# Patient Record
Sex: Female | Born: 1965 | Race: White | Hispanic: No | Marital: Married | Smoking: Never smoker
Health system: Southern US, Community
[De-identification: ages and names within clinical notes are randomized; demographics above are authoritative.]

## PROBLEM LIST (undated history)

## (undated) DIAGNOSIS — T4145XA Adverse effect of unspecified anesthetic, initial encounter: Secondary | ICD-10-CM

## (undated) DIAGNOSIS — R112 Nausea with vomiting, unspecified: Secondary | ICD-10-CM

## (undated) DIAGNOSIS — K831 Obstruction of bile duct: Secondary | ICD-10-CM

## (undated) DIAGNOSIS — T8859XA Other complications of anesthesia, initial encounter: Secondary | ICD-10-CM

## (undated) DIAGNOSIS — O30009 Twin pregnancy, unspecified number of placenta and unspecified number of amniotic sacs, unspecified trimester: Secondary | ICD-10-CM

## (undated) DIAGNOSIS — Z9889 Other specified postprocedural states: Secondary | ICD-10-CM

## (undated) DIAGNOSIS — E039 Hypothyroidism, unspecified: Secondary | ICD-10-CM

---

## 2001-04-19 HISTORY — PX: PANCREATIC CYST DRAINAGE: SHX2156

## 2004-11-19 ENCOUNTER — Encounter (INDEPENDENT_AMBULATORY_CARE_PROVIDER_SITE_OTHER): Payer: Self-pay | Admitting: Specialist

## 2004-11-19 ENCOUNTER — Ambulatory Visit (HOSPITAL_COMMUNITY): Admission: RE | Admit: 2004-11-19 | Discharge: 2004-11-19 | Payer: Self-pay | Admitting: *Deleted

## 2004-12-24 ENCOUNTER — Other Ambulatory Visit: Admission: RE | Admit: 2004-12-24 | Discharge: 2004-12-24 | Payer: Self-pay | Admitting: Obstetrics and Gynecology

## 2005-01-15 ENCOUNTER — Encounter: Admission: RE | Admit: 2005-01-15 | Discharge: 2005-01-15 | Payer: Self-pay | Admitting: Gastroenterology

## 2010-01-26 ENCOUNTER — Encounter: Admission: RE | Admit: 2010-01-26 | Discharge: 2010-01-26 | Payer: Self-pay | Admitting: Obstetrics and Gynecology

## 2010-01-29 ENCOUNTER — Encounter: Admission: RE | Admit: 2010-01-29 | Discharge: 2010-01-29 | Payer: Self-pay | Admitting: Obstetrics and Gynecology

## 2011-06-24 LAB — OB RESULTS CONSOLE RPR: RPR: NONREACTIVE

## 2011-06-24 LAB — OB RESULTS CONSOLE GC/CHLAMYDIA: Chlamydia: NEGATIVE

## 2011-06-24 LAB — OB RESULTS CONSOLE HEPATITIS B SURFACE ANTIGEN: Hepatitis B Surface Ag: NEGATIVE

## 2011-06-24 LAB — OB RESULTS CONSOLE ABO/RH: RH Type: POSITIVE

## 2011-06-24 LAB — OB RESULTS CONSOLE HIV ANTIBODY (ROUTINE TESTING): HIV: NONREACTIVE

## 2011-07-20 ENCOUNTER — Other Ambulatory Visit: Payer: Self-pay

## 2011-11-03 ENCOUNTER — Encounter: Payer: BC Managed Care – PPO | Attending: Obstetrics and Gynecology | Admitting: *Deleted

## 2011-11-03 VITALS — Ht 70.5 in | Wt 257.9 lb

## 2011-11-03 DIAGNOSIS — Z713 Dietary counseling and surveillance: Secondary | ICD-10-CM | POA: Insufficient documentation

## 2011-11-03 DIAGNOSIS — O24419 Gestational diabetes mellitus in pregnancy, unspecified control: Secondary | ICD-10-CM

## 2011-11-03 DIAGNOSIS — O99810 Abnormal glucose complicating pregnancy: Secondary | ICD-10-CM | POA: Insufficient documentation

## 2011-11-04 ENCOUNTER — Encounter: Payer: Self-pay | Admitting: *Deleted

## 2011-11-04 NOTE — Patient Instructions (Signed)
Goals:  Check glucose levels per MD as instructed  Follow Gestational Diabetes Diet as instructed  Call for follow-up as needed    

## 2011-11-04 NOTE — Progress Notes (Signed)
  Patient was seen on 11/03/11 for Gestational Diabetes self-management class at the [facility]. The following learning objectives were met by the patient during this course:   States the definition of Gestational Diabetes  States why dietary management is important in controlling blood glucose  Describes the effects each nutrient has on blood glucose levels  Demonstrates ability to create a balanced meal plan  Demonstrates carbohydrate counting   States when to check blood glucose levels  Demonstrates proper blood glucose monitoring techniques  States the effect of stress and exercise on blood glucose levels  States the importance of limiting caffeine and abstaining from alcohol and smoking  Blood glucose monitor given: One Touch Ultra Mini Self Monitoring Kit  Lot # [id number]  Exp: 1/14 Blood glucose reading: 89 mg/dl  Patient instructed to monitor glucose levels: FBS: 60 - <90 2 hour: <120  *Patient received handouts:  Nutrition Diabetes and Pregnancy  Carbohydrate Counting List  Patient will be seen for follow-up as needed.

## 2011-11-10 ENCOUNTER — Other Ambulatory Visit (HOSPITAL_COMMUNITY): Payer: Self-pay | Admitting: Obstetrics and Gynecology

## 2011-11-17 ENCOUNTER — Ambulatory Visit (HOSPITAL_COMMUNITY)
Admission: RE | Admit: 2011-11-17 | Discharge: 2011-11-17 | Disposition: A | Discharge status: Home / Self Care | Payer: BC Managed Care – PPO | Source: Ambulatory Visit | Attending: Obstetrics and Gynecology | Admitting: Obstetrics and Gynecology

## 2011-11-17 ENCOUNTER — Encounter (HOSPITAL_COMMUNITY): Payer: Self-pay

## 2011-11-17 DIAGNOSIS — O30009 Twin pregnancy, unspecified number of placenta and unspecified number of amniotic sacs, unspecified trimester: Secondary | ICD-10-CM | POA: Insufficient documentation

## 2011-11-17 DIAGNOSIS — O99810 Abnormal glucose complicating pregnancy: Secondary | ICD-10-CM | POA: Insufficient documentation

## 2011-11-17 DIAGNOSIS — E079 Disorder of thyroid, unspecified: Secondary | ICD-10-CM | POA: Insufficient documentation

## 2011-11-17 IMAGING — US US OB DETAIL+14 WK
1 series · 14 of 28 positions shown · non-contrast
Comparison: none

[Series 1: us ob detail+14 wk · 0.30mm/px · 14 of 138 slices shown]
[im 6/138]
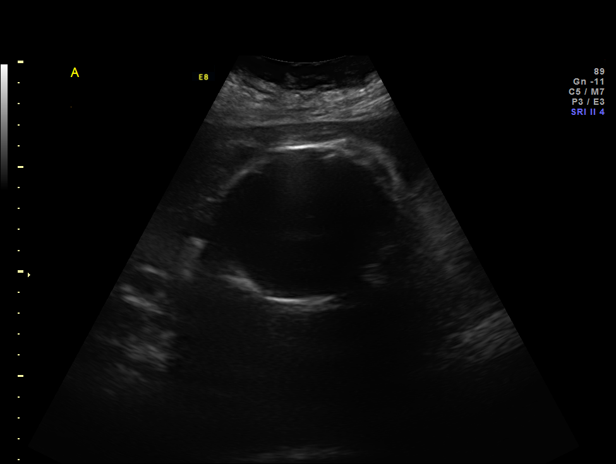
[im 16/138]
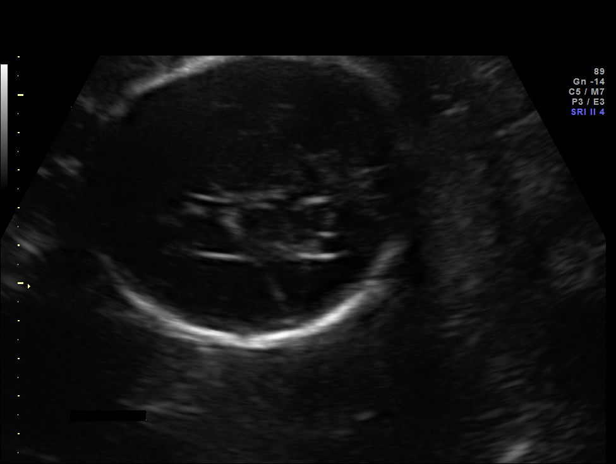
[im 26/138]
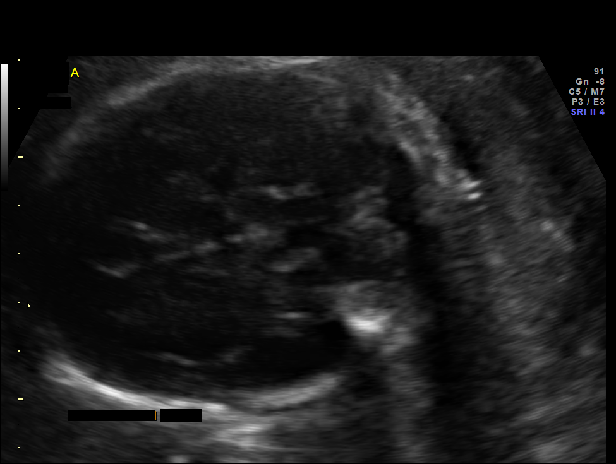
[im 36/138]
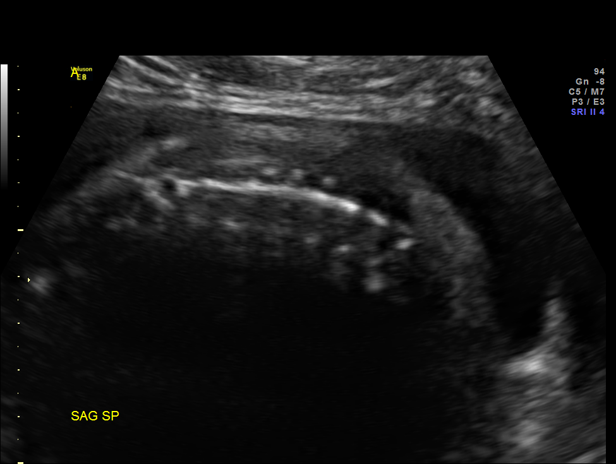
[im 46/138]
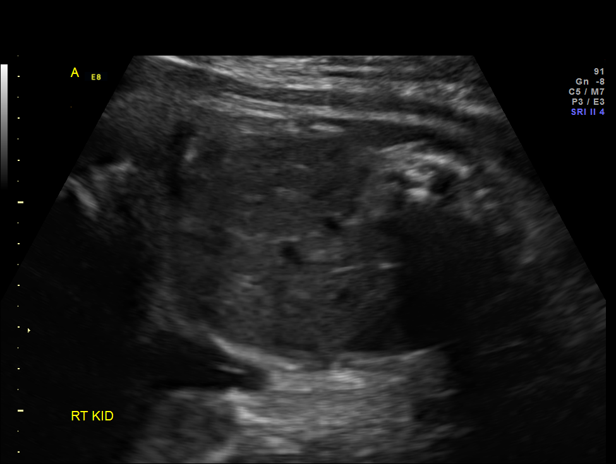
[im 56/138]
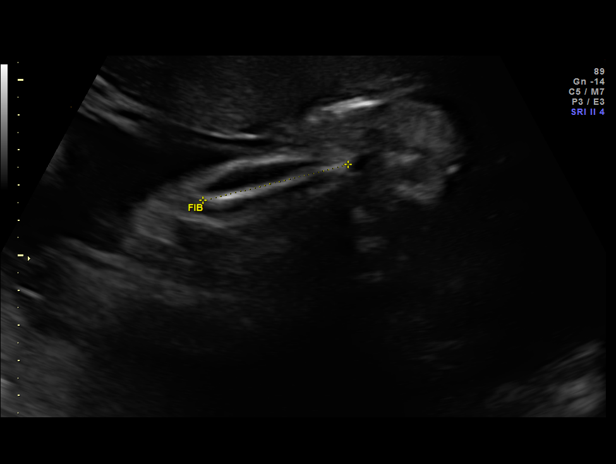
[im 66/138]
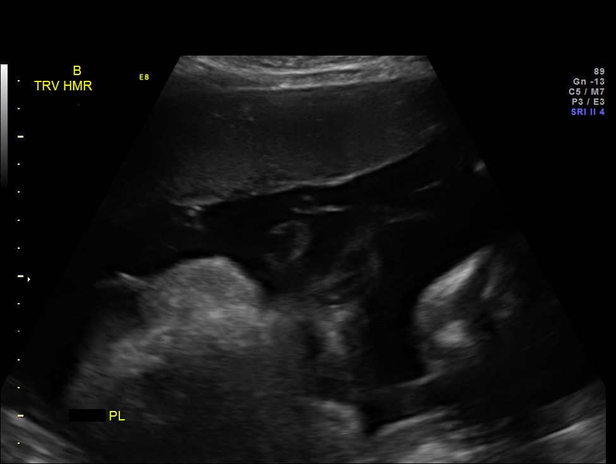
[im 77/138]
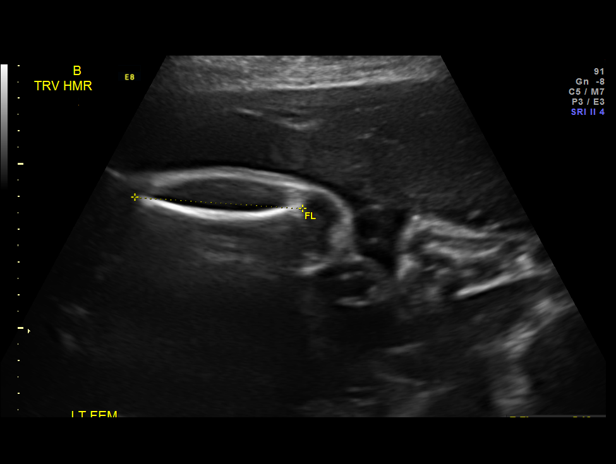
[im 87/138]
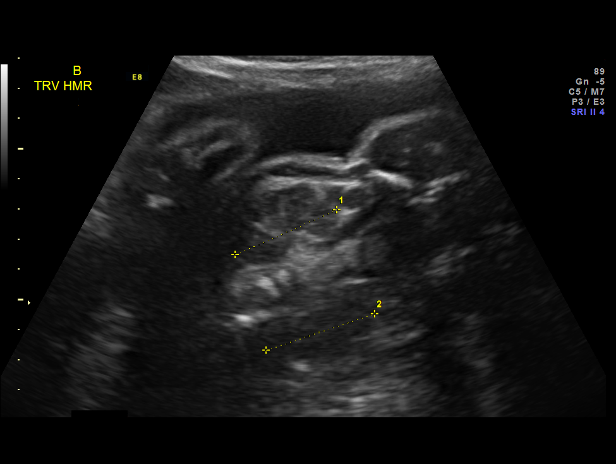
[im 97/138]
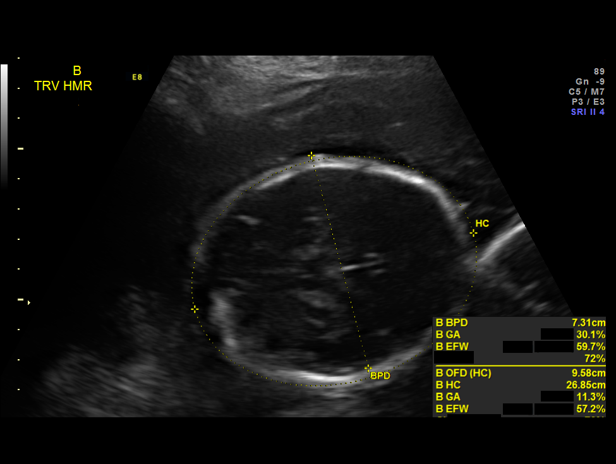
[im 107/138]
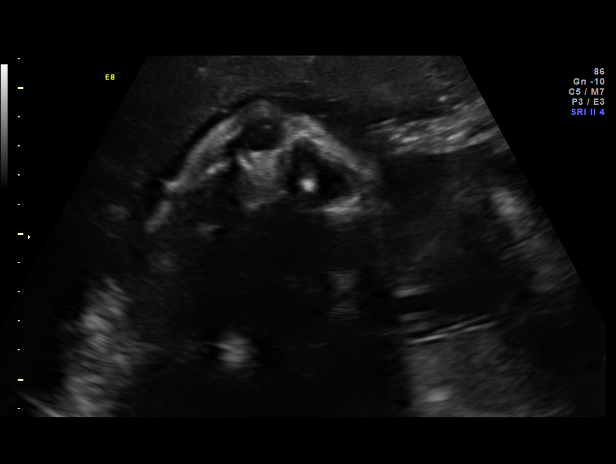
[im 117/138]
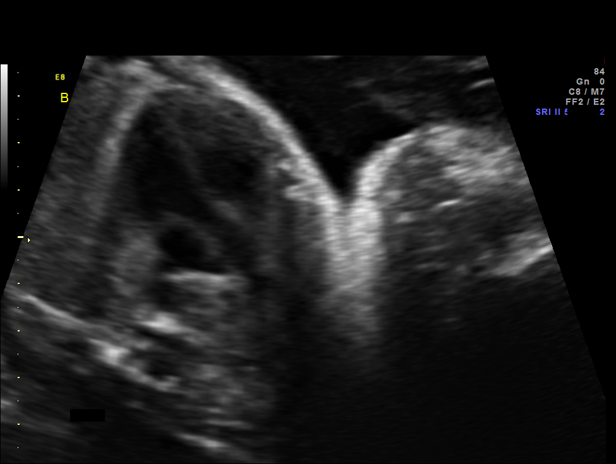
[im 127/138]
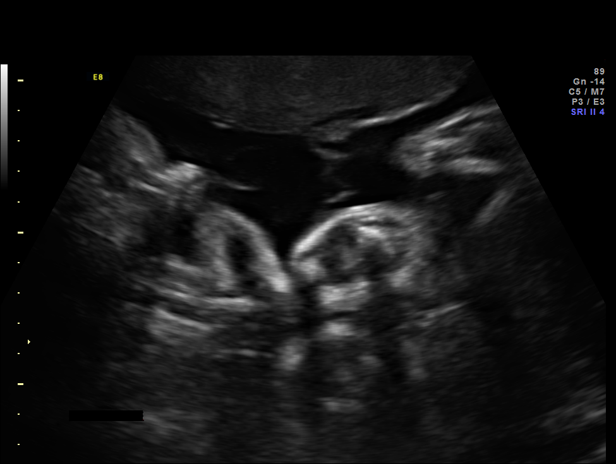
[im 138/138]
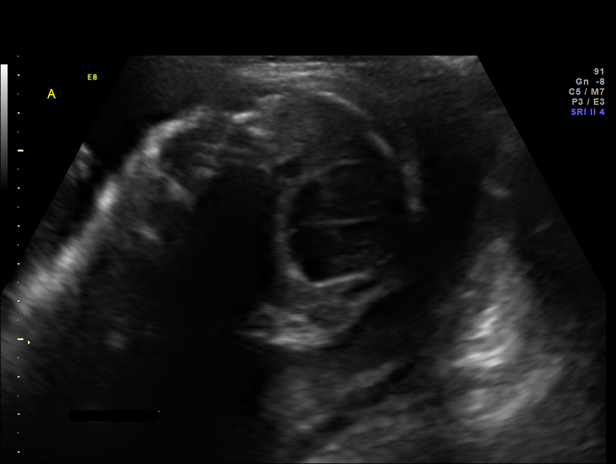

[14 of 28 positions shown; findings below may reference images not displayed]

OBSTETRICS REPORT
                      (Signed Final [DATE] [DATE])

 Order#:         [PHONE_NUMBER]_O,[PHONE_NUMBER]
                 _O
Procedures

 US OB DETAIL + 14 WK                                  76811.0
 US OB DETAIL ADDL GEST + 14 WK                        76811.1
Indications

 IVF(donor eggs 26 y.o.)
 Twin gestation, Di-Di
 Hypothyroid
 Diabetes - Gestational, A1
Fetal Evaluation (Fetus A)

 Fetal Heart Rate:  138                          bpm
 Cardiac Activity:  Observed
 Presentation:      Cephalic
 Placenta:          Posterior, above cervical
                    os
 P. Cord            Visualized
 Insertion:

 Amniotic Fluid
 AFI FV:      Subjectively within normal limits
                                             Larg Pckt:     4.8  cm
Biometry (Fetus A)

 BPD:     73.8  mm     G. Age:  29w 4d                CI:         76.0   70 - 86
 OFD:     97.1  mm                                    FL/HC:      18.3   19.2 -

 HC:     272.6  mm     G. Age:  29w 5d       10  %    HC/AC:      1.02   0.99 -

 AC:     266.3  mm     G. Age:  30w 5d       63  %    FL/BPD:     67.5   71 - 87
 FL:      49.8  mm     G. Age:  26w 6d      < 3  %    FL/AC:      18.7   20 - 24
 HUM:     46.7  mm     G. Age:  27w 4d      < 5  %
 ULN:     47.2  mm     G. Age:  30w 0d       38  %
 TIB:     43.6  mm     G. Age:  26w 6d      < 5  %
 RAD:     40.5  mm     G. Age:  28w 2d       37  %
 FIB:     43.4  mm     G. Age:  26w 5d       31  %

 Est. FW:    [FM]  gm           3 lb     36  %     FW Discordancy         8  %
Gestational Age (Fetus A)

 LMP:           30w 1d        Date:  [DATE]                 EDD:   [DATE]
 U/S Today:     29w 1d                                        EDD:   [DATE]
 Best:          30w 1d     Det. By:  LMP  ([DATE])          EDD:   [DATE]
Anatomy (Fetus A)

 Cranium:           Appears normal      LVOT:              Not well
                                                           visualized
 Fetal Cavum:       Appears normal      Aortic Arch:       Not well
                                                           visualized
 Ventricles:        Appears normal      Ductal Arch:       Not well
                                                           visualized
 Choroid Plexus:    Appears normal      Diaphragm:         Appears normal
 Cerebellum:        Appears normal      Stomach:           Appears
                                                           normal, left
                                                           sided
 Posterior Fossa:   Appears normal      Abdomen:           Appears normal
 Nuchal Fold:       Not applicable      Abdominal Wall:    Appears nml
                    (>20 wks GA)                           (cord insert,
                                                           abd wall)
 Face:              Not well            Kidneys:           Appear normal
                    visualized
 Heart:             Appears normal      Spine:             Limited views
                    (4 chamber &                           appear normal
                    axis)
 RVOT:              Not well
                    visualized

Fetal Evaluation (Fetus B)

 Fetal Heart Rate:  144                          bpm
 Cardiac Activity:  Observed
 Fetal Lie:         Upper Fetus
 Presentation:      Transverse, head to
                    maternal right
 Placenta:          Anterior, above cervical os
 P. Cord            Visualized
 Insertion:

 Amniotic Fluid
 AFI FV:      Subjectively within normal limits
                                             Larg Pckt:     3.2  cm
Biometry (Fetus B)

 BPD:       74  mm     G. Age:  29w 5d                CI:         74.9   70 - 86
 OFD:     98.8  mm                                    FL/HC:      18.7   19.2 -

 HC:     276.5  mm     G. Age:  30w 2d       19  %    HC/AC:      1.01   0.99 -

 AC:     274.5  mm     G. Age:  31w 4d       82  %    FL/BPD:     69.9   71 - 87
 FL:      51.7  mm     G. Age:  27w 4d      < 3  %    FL/AC:      18.8   20 - 24
 HUM:     47.4  mm     G. Age:  27w 6d        6  %
 ULN:     41.7  mm     G. Age:  27w 0d      < 5  %
 TIB:     47.2  mm     G. Age:  28w 5d       17  %
 RAD:     41.9  mm     G. Age:  29w 0d       44  %
 FIB:     44.9  mm     G. Age:  27w 6d       39  %
 Est. FW:    [FM]  gm      3 lb 5 oz     52  %     FW Discordancy      0 \ 8 %
Gestational Age (Fetus B)

 LMP:           30w 1d        Date:  [DATE]                 EDD:   [DATE]
 U/S Today:     29w 5d                                        EDD:   [DATE]
 Best:          30w 1d     Det. By:  LMP  ([DATE])          EDD:   [DATE]
Anatomy (Fetus B)

 Cranium:           Appears normal      Aortic Arch:       Not well
                                                           visualized
 Fetal Cavum:       Appears normal      Ductal Arch:       Appears normal
 Ventricles:        Appears normal      Diaphragm:         Appears normal
 Choroid Plexus:    Not well            Stomach:           Appears
                    visualized                             normal, left
                                                           sided
 Cerebellum:        Not well            Abdomen:           Appears normal
                    visualized
 Posterior Fossa:   Not well            Abdominal Wall:    Appears nml
                    visualized                             (cord insert,
                                                           abd wall)
 Nuchal Fold:       Not applicable      Cord Vessels:      Appears normal
                    (>20 wks GA)                           (3 vessel cord)
 Face:              Appears normal      Kidneys:           Appear normal
                    (lips/profile/orbit
                    s)
 Heart:             Appears normal      Bladder:           Appears normal
                    (4 chamber &
                    axis)
 RVOT:              Appears normal      Spine:             Not well
                                                           visualized
 LVOT:              Appears normal      Limbs:             Not well
                                                           visualized
Cervix Uterus Adnexa

 Cervix:       Normal appearance by transabdominal scan.
Impression

 DC/DA twin gestation with best dates of 30 [DATE] weeks
 Fetal growth is appropriate and concordant.
 A small area of "separation" is noted along the dividing
 membrane - clinical significance uncertain, but likely artifact

 A: Cephalic presentation, posterior placenta, maternal right,
 fetal growth appropriate (36th %tile).  Limited views of the
 fetal heart and face were obtained due to fetal prosition and
 late gestational age.

 B: Transverse presentation with fetal head to the maternal
 right, anterior placenta, fetal growth appropriate (52nd %tile).
 Limited views of the posterior fossa/ spine obtained due to
 fetal position.
Recommendations

 Recommend follow up ultrasound as clinically indicated.

 questions or concerns.

## 2011-11-17 NOTE — Progress Notes (Signed)
Patient seen today  for ultrasound.  See full report in [facility].  Alpha Gula, MD  DC/DA twin gestation with best dates of 30 1/7 weeks Fetal growth is appropriate and concordant. A small area of "separation" is noted along the dividing membrane - clinical significance uncertain, but likely artifact  A: Cephalic presentation, posterior placenta, maternal right, fetal growth appropriate (36th %tile).  Limited views of the fetal heart and face were obtained due to fetal prosition and late gestational age.  B: Transverse presentation with fetal head to the maternal right, anterior placenta, fetal growth appropriate (52nd %tile).  Limited views of the posterior fossa/ spine obtained due to fetal position.   Recommend follow up ultrasound as clinically indicated.

## 2011-11-29 ENCOUNTER — Telehealth: Payer: Self-pay | Admitting: *Deleted

## 2011-11-29 NOTE — Telephone Encounter (Signed)
I spoke w/Dr. Henderson Cloud earlier today regarding pt's appt for NST tomorrow- she will be 31.5 or 31.2 wks depending on due date. (prenatal record shows 01/27/12 and 01/30/12)  He stated to begin NST testing after 32 completed weeks. I called pt now and explained the situation and that she will not be needing her appt tomorrow as scheduled. She should keep her appt at the office 8/13 for prenatal visit. I asked pt to confirm EDD with MD so that NST appt could be rescheduled appropriately. I will call pt on 8/14 to coordinate her NST and next MD visit.  Pt agreed and voiced understanding.

## 2011-11-30 ENCOUNTER — Other Ambulatory Visit: Payer: BC Managed Care – PPO

## 2011-12-07 ENCOUNTER — Ambulatory Visit (INDEPENDENT_AMBULATORY_CARE_PROVIDER_SITE_OTHER): Payer: BC Managed Care – PPO | Admitting: *Deleted

## 2011-12-07 VITALS — BP 139/83 | Wt 261.2 lb

## 2011-12-07 DIAGNOSIS — O30009 Twin pregnancy, unspecified number of placenta and unspecified number of amniotic sacs, unspecified trimester: Secondary | ICD-10-CM

## 2011-12-07 NOTE — Progress Notes (Signed)
Copy of NST report and tracing sent to MD office w/pt today

## 2011-12-13 ENCOUNTER — Ambulatory Visit (INDEPENDENT_AMBULATORY_CARE_PROVIDER_SITE_OTHER): Payer: BC Managed Care – PPO | Admitting: *Deleted

## 2011-12-13 VITALS — BP 133/84 | Wt 265.1 lb

## 2011-12-13 DIAGNOSIS — O99810 Abnormal glucose complicating pregnancy: Secondary | ICD-10-CM | POA: Insufficient documentation

## 2011-12-13 DIAGNOSIS — O99280 Endocrine, nutritional and metabolic diseases complicating pregnancy, unspecified trimester: Secondary | ICD-10-CM

## 2011-12-13 DIAGNOSIS — O30009 Twin pregnancy, unspecified number of placenta and unspecified number of amniotic sacs, unspecified trimester: Secondary | ICD-10-CM | POA: Insufficient documentation

## 2011-12-13 DIAGNOSIS — O09519 Supervision of elderly primigravida, unspecified trimester: Secondary | ICD-10-CM

## 2011-12-13 DIAGNOSIS — E039 Hypothyroidism, unspecified: Secondary | ICD-10-CM | POA: Insufficient documentation

## 2011-12-13 NOTE — Progress Notes (Signed)
P = 79   Copy of report and tracing sent to Dr. Vincente Poli w/pt today

## 2011-12-22 ENCOUNTER — Ambulatory Visit (INDEPENDENT_AMBULATORY_CARE_PROVIDER_SITE_OTHER): Payer: BC Managed Care – PPO | Admitting: *Deleted

## 2011-12-22 VITALS — BP 149/92 | Wt 267.5 lb

## 2011-12-22 DIAGNOSIS — K839 Disease of biliary tract, unspecified: Secondary | ICD-10-CM

## 2011-12-22 DIAGNOSIS — K769 Liver disease, unspecified: Secondary | ICD-10-CM

## 2011-12-22 DIAGNOSIS — O26619 Liver and biliary tract disorders in pregnancy, unspecified trimester: Secondary | ICD-10-CM | POA: Insufficient documentation

## 2011-12-22 DIAGNOSIS — O30009 Twin pregnancy, unspecified number of placenta and unspecified number of amniotic sacs, unspecified trimester: Secondary | ICD-10-CM

## 2011-12-22 NOTE — Progress Notes (Signed)
P = 72     Pt denies H/A's, dizziness or visual disturbances.   Copy of NST tracing and report sent to Dr. Marcelle Overlie w/pt today

## 2011-12-27 ENCOUNTER — Encounter (HOSPITAL_COMMUNITY): Payer: Self-pay | Admitting: Pharmacist

## 2011-12-28 ENCOUNTER — Ambulatory Visit (INDEPENDENT_AMBULATORY_CARE_PROVIDER_SITE_OTHER): Payer: BC Managed Care – PPO | Admitting: *Deleted

## 2011-12-28 VITALS — BP 127/88 | Wt 268.1 lb

## 2011-12-28 DIAGNOSIS — O30009 Twin pregnancy, unspecified number of placenta and unspecified number of amniotic sacs, unspecified trimester: Secondary | ICD-10-CM

## 2011-12-28 DIAGNOSIS — K839 Disease of biliary tract, unspecified: Secondary | ICD-10-CM

## 2011-12-28 DIAGNOSIS — O26619 Liver and biliary tract disorders in pregnancy, unspecified trimester: Secondary | ICD-10-CM

## 2011-12-28 DIAGNOSIS — K769 Liver disease, unspecified: Secondary | ICD-10-CM

## 2011-12-28 NOTE — Progress Notes (Signed)
P = 79  Copy of report and tracing sent to office w/pt today

## 2011-12-31 ENCOUNTER — Ambulatory Visit (INDEPENDENT_AMBULATORY_CARE_PROVIDER_SITE_OTHER): Payer: BC Managed Care – PPO | Admitting: *Deleted

## 2011-12-31 VITALS — BP 149/83 | Wt 265.8 lb

## 2011-12-31 DIAGNOSIS — O30009 Twin pregnancy, unspecified number of placenta and unspecified number of amniotic sacs, unspecified trimester: Secondary | ICD-10-CM

## 2011-12-31 DIAGNOSIS — K769 Liver disease, unspecified: Secondary | ICD-10-CM

## 2011-12-31 DIAGNOSIS — O26619 Liver and biliary tract disorders in pregnancy, unspecified trimester: Secondary | ICD-10-CM

## 2011-12-31 DIAGNOSIS — K839 Disease of biliary tract, unspecified: Secondary | ICD-10-CM

## 2011-12-31 NOTE — Progress Notes (Signed)
P = 73   Pt reports having a H/A yesterday but none today. She denies visual disturbances. Copy of report and tracing sent to MD office w/pt today

## 2012-01-03 ENCOUNTER — Encounter (HOSPITAL_COMMUNITY): Payer: Self-pay

## 2012-01-03 ENCOUNTER — Encounter (HOSPITAL_COMMUNITY)
Admission: RE | Admit: 2012-01-03 | Discharge: 2012-01-03 | Disposition: A | Discharge status: Home / Self Care | Payer: BC Managed Care – PPO | Source: Ambulatory Visit | Attending: Obstetrics and Gynecology | Admitting: Obstetrics and Gynecology

## 2012-01-03 HISTORY — DX: Twin pregnancy, unspecified number of placenta and unspecified number of amniotic sacs, unspecified trimester: O30.009

## 2012-01-03 HISTORY — DX: Other complications of anesthesia, initial encounter: T88.59XA

## 2012-01-03 HISTORY — DX: Nausea with vomiting, unspecified: R11.2

## 2012-01-03 HISTORY — DX: Other specified postprocedural states: Z98.890

## 2012-01-03 HISTORY — DX: Adverse effect of unspecified anesthetic, initial encounter: T41.45XA

## 2012-01-03 HISTORY — DX: Obstruction of bile duct: K83.1

## 2012-01-03 HISTORY — DX: Hypothyroidism, unspecified: E03.9

## 2012-01-03 LAB — CBC
HCT: 44.5 % (ref 36.0–46.0)
MCHC: 33.7 g/dL (ref 30.0–36.0)
MCV: 90.8 fL (ref 78.0–100.0)
RDW: 13.9 % (ref 11.5–15.5)

## 2012-01-03 LAB — SURGICAL PCR SCREEN: Staphylococcus aureus: NEGATIVE

## 2012-01-03 LAB — ABO/RH: ABO/RH(D): O POS

## 2012-01-03 NOTE — Patient Instructions (Addendum)
   Your procedure is scheduled on: Tuesday September 24th  Enter through the [employer] [facility] at: [employer] up the phone at the desk and dial [phone] and inform us of your arrival.  Please call this number if you have any problems the morning of surgery: [phone]  Remember: Do not eat or drink anything after midnight on Monday night  You may take your synthroid in the morning day of surgery with sips of water  Do not wear jewelry, make-up, or FINGER nail polish No metal in your hair or on your body. Do not wear lotions, powders, perfumes or deodorant. Do not shave 48 hours prior to surgery. Do not bring valuables to the hospital.  Leave suitcase in the car. After Surgery it may be brought to your room. For patients being admitted to the hospital, checkout time is 11:00am the day of discharge.   Remember to use your hibiclens as instructed.Please shower with 1/2 bottle the evening before your surgery and the other 1/2 bottle the morning of surgery. Neck down avoiding private area.

## 2012-01-04 ENCOUNTER — Ambulatory Visit (INDEPENDENT_AMBULATORY_CARE_PROVIDER_SITE_OTHER): Payer: BC Managed Care – PPO | Admitting: *Deleted

## 2012-01-04 VITALS — BP 141/92 | Wt 266.3 lb

## 2012-01-04 DIAGNOSIS — O30009 Twin pregnancy, unspecified number of placenta and unspecified number of amniotic sacs, unspecified trimester: Secondary | ICD-10-CM

## 2012-01-04 DIAGNOSIS — O26619 Liver and biliary tract disorders in pregnancy, unspecified trimester: Secondary | ICD-10-CM

## 2012-01-04 DIAGNOSIS — K839 Disease of biliary tract, unspecified: Secondary | ICD-10-CM

## 2012-01-04 DIAGNOSIS — K769 Liver disease, unspecified: Secondary | ICD-10-CM

## 2012-01-04 NOTE — Progress Notes (Signed)
P = 68   Pt denies H/A or visual disturbances.  Copy of report and tracing sent to Dr. Rana Snare w/pt today

## 2012-01-07 ENCOUNTER — Ambulatory Visit (INDEPENDENT_AMBULATORY_CARE_PROVIDER_SITE_OTHER): Payer: BC Managed Care – PPO | Admitting: *Deleted

## 2012-01-07 VITALS — BP 132/82 | Wt 265.6 lb

## 2012-01-07 DIAGNOSIS — K839 Disease of biliary tract, unspecified: Secondary | ICD-10-CM

## 2012-01-07 DIAGNOSIS — O26619 Liver and biliary tract disorders in pregnancy, unspecified trimester: Secondary | ICD-10-CM

## 2012-01-07 DIAGNOSIS — K769 Liver disease, unspecified: Secondary | ICD-10-CM

## 2012-01-07 DIAGNOSIS — O30009 Twin pregnancy, unspecified number of placenta and unspecified number of amniotic sacs, unspecified trimester: Secondary | ICD-10-CM

## 2012-01-07 NOTE — Progress Notes (Signed)
P = 67   Copy of report and tracing sent to Dr. Marcelle Overlie w/pt today

## 2012-01-10 NOTE — H&P (Addendum)
Briana Perez is a 46 y.o. female presenting for Primary C/S due to twins at 70 1/2 weeks, Vtx/Breech and Gestational HTN, Cholestasis, Gest DM A2dm.   Good dietary control of Gest DM.  Cholestasis diagnosed and treated 2 weeks ago with moderate relief with Urosdiol.  Good fetal growth. IVF twins with donor eggs.. History OB History    Grav Para Term Preterm Abortions TAB SAB Ect Mult Living   1              Past Medical History  Diagnosis Date  . Cholestasis     actigall  . Newborn product of in vitro fertilization (IVF) pregnancy   . Twin pregnancy   . Hypothyroidism   . Complication of anesthesia   . PONV (postoperative nausea and vomiting)   . Diabetes mellitus     gestational diabetes-diet controlled-fbs-75-90, 2 hour pp-88   Past Surgical History  Procedure Date  . Pancreatic cyst drainage 2003   Family History: family history is not on file. Social History:  reports that she has never smoked. She does not have any smokeless tobacco history on file. She reports that she does not use illicit drugs. Her alcohol history not on file.   Prenatal Transfer Tool  Maternal Diabetes: Yes:  Diabetes Type:  Diet controlled Genetic Screening: Normal Maternal Ultrasounds/Referrals: Normal Fetal Ultrasounds or other Referrals:  None Maternal Substance Abuse:  No Significant Maternal Medications:  None Significant Maternal Lab Results:  None Other Comments:  None  ROS    Height 5\' 11"  (1.803 m), weight 121.564 kg (268 lb), last menstrual period 04/20/2011. Exam Physical Exam  Cx Cl th high DTRs 1/4 Trace edema BP 134/98 Prenatal labs: ABO, Rh: --/--/O POS (09/16 1030) Antibody: NEG (09/16 1030) Rubella: Immune (03/07 0000) RPR: NON REACTIVE (09/16 1030)  HBsAg: Negative (03/07 0000)  HIV: Non-reactive (03/07 0000)  GBS:     Assessment/Plan: IVF twins and 37 1/2 weeks , Vtx/Breech for primary C/S Normal growth A2DM - good control Cholestasis - controlled with  Meds Gest HTN - no sxs of Preeclampsia.  BP controlled with rest Risks and benefits of C/S were discussed.  All questions were answered and informed consent was obtained.  Plan to proceed with low segment transverse Cesarean Section.    Roen Macgowan C 01/10/2012, 6:42 PM    01/11/12  This patient has been seen and examined.   All of her questions were answered.  Labs and vital signs reviewed.  Informed consent has been obtained.  The History and Physical is current.  DL

## 2012-01-11 ENCOUNTER — Encounter (HOSPITAL_COMMUNITY)
Admission: AD | Disposition: A | Discharge status: Home / Self Care | Payer: Self-pay | Source: Ambulatory Visit | Attending: Obstetrics and Gynecology

## 2012-01-11 ENCOUNTER — Inpatient Hospital Stay (HOSPITAL_COMMUNITY)
Admission: AD | Admit: 2012-01-11 | Discharge: 2012-01-14 | DRG: 370 | Disposition: A | Discharge status: Home / Self Care | Payer: BC Managed Care – PPO | Source: Ambulatory Visit | Attending: Obstetrics and Gynecology | Admitting: Obstetrics and Gynecology

## 2012-01-11 ENCOUNTER — Encounter (HOSPITAL_COMMUNITY): Payer: Self-pay | Admitting: *Deleted

## 2012-01-11 ENCOUNTER — Encounter (HOSPITAL_COMMUNITY): Payer: Self-pay | Admitting: Registered Nurse

## 2012-01-11 ENCOUNTER — Encounter (HOSPITAL_COMMUNITY): Payer: Self-pay

## 2012-01-11 ENCOUNTER — Inpatient Hospital Stay (HOSPITAL_COMMUNITY): Payer: BC Managed Care – PPO | Admitting: Registered Nurse

## 2012-01-11 DIAGNOSIS — K838 Other specified diseases of biliary tract: Secondary | ICD-10-CM | POA: Diagnosis present

## 2012-01-11 DIAGNOSIS — E039 Hypothyroidism, unspecified: Secondary | ICD-10-CM | POA: Diagnosis present

## 2012-01-11 DIAGNOSIS — O99810 Abnormal glucose complicating pregnancy: Secondary | ICD-10-CM

## 2012-01-11 DIAGNOSIS — O3090 Multiple gestation, unspecified, unspecified trimester: Principal | ICD-10-CM | POA: Diagnosis present

## 2012-01-11 DIAGNOSIS — O30009 Twin pregnancy, unspecified number of placenta and unspecified number of amniotic sacs, unspecified trimester: Secondary | ICD-10-CM

## 2012-01-11 DIAGNOSIS — O09519 Supervision of elderly primigravida, unspecified trimester: Secondary | ICD-10-CM | POA: Diagnosis present

## 2012-01-11 DIAGNOSIS — O139 Gestational [pregnancy-induced] hypertension without significant proteinuria, unspecified trimester: Secondary | ICD-10-CM | POA: Diagnosis present

## 2012-01-11 DIAGNOSIS — O99284 Endocrine, nutritional and metabolic diseases complicating childbirth: Secondary | ICD-10-CM | POA: Diagnosis present

## 2012-01-11 DIAGNOSIS — O99280 Endocrine, nutritional and metabolic diseases complicating pregnancy, unspecified trimester: Secondary | ICD-10-CM

## 2012-01-11 DIAGNOSIS — O26619 Liver and biliary tract disorders in pregnancy, unspecified trimester: Secondary | ICD-10-CM | POA: Diagnosis present

## 2012-01-11 DIAGNOSIS — O99814 Abnormal glucose complicating childbirth: Secondary | ICD-10-CM | POA: Diagnosis present

## 2012-01-11 DIAGNOSIS — O329XX0 Maternal care for malpresentation of fetus, unspecified, not applicable or unspecified: Principal | ICD-10-CM | POA: Diagnosis present

## 2012-01-11 DIAGNOSIS — E079 Disorder of thyroid, unspecified: Secondary | ICD-10-CM | POA: Diagnosis present

## 2012-01-11 LAB — GLUCOSE, CAPILLARY: Glucose-Capillary: 85 mg/dL (ref 70–99)

## 2012-01-11 LAB — PREPARE RBC (CROSSMATCH)

## 2012-01-11 LAB — CBC
HCT: 35.5 % — ABNORMAL LOW (ref 36.0–46.0)
MCH: 30.8 pg (ref 26.0–34.0)
MCV: 91.3 fL (ref 78.0–100.0)
Platelets: 136 10*3/uL — ABNORMAL LOW (ref 150–400)
RDW: 14.2 % (ref 11.5–15.5)

## 2012-01-11 SURGERY — Surgical Case
Anesthesia: Spinal | Site: Abdomen | Wound class: Clean Contaminated

## 2012-01-11 MED ORDER — PHENYLEPHRINE 40 MCG/ML (10ML) SYRINGE FOR IV PUSH (FOR BLOOD PRESSURE SUPPORT)
PREFILLED_SYRINGE | INTRAVENOUS | Status: AC
  Filled 2012-01-11: qty 5

## 2012-01-11 MED ORDER — FENTANYL CITRATE 0.05 MG/ML IJ SOLN
INTRAMUSCULAR | Status: DC | PRN
  Administered 2012-01-11: 15 ug via INTRATHECAL

## 2012-01-11 MED ORDER — KETOROLAC TROMETHAMINE 30 MG/ML IJ SOLN: 30.0000 mg | Freq: Four times a day (QID) | INTRAMUSCULAR | Status: AC | PRN

## 2012-01-11 MED ORDER — ZOLPIDEM TARTRATE 5 MG PO TABS: 5.0000 mg | ORAL_TABLET | Freq: Every evening | ORAL | Status: DC | PRN

## 2012-01-11 MED ORDER — NALOXONE HCL 0.4 MG/ML IJ SOLN: 0.4000 mg | INTRAMUSCULAR | Status: DC | PRN

## 2012-01-11 MED ORDER — PRENATAL MULTIVITAMIN CH: 1.0000 | ORAL_TABLET | Freq: Every day | ORAL | Status: DC

## 2012-01-11 MED ORDER — LACTATED RINGERS IV SOLN
INTRAVENOUS | Status: DC
  Administered 2012-01-11: 22:00:00 via INTRAVENOUS

## 2012-01-11 MED ORDER — MORPHINE SULFATE 0.5 MG/ML IJ SOLN
INTRAMUSCULAR | Status: AC
  Filled 2012-01-11: qty 10

## 2012-01-11 MED ORDER — MEPERIDINE HCL 25 MG/ML IJ SOLN: 6.2500 mg | INTRAMUSCULAR | Status: DC | PRN

## 2012-01-11 MED ORDER — ONDANSETRON HCL 4 MG PO TABS: 4.0000 mg | ORAL_TABLET | ORAL | Status: DC | PRN

## 2012-01-11 MED ORDER — PHENYLEPHRINE 40 MCG/ML (10ML) SYRINGE FOR IV PUSH (FOR BLOOD PRESSURE SUPPORT)
PREFILLED_SYRINGE | INTRAVENOUS | Status: AC
  Filled 2012-01-11: qty 10

## 2012-01-11 MED ORDER — EPHEDRINE 5 MG/ML INJ
INTRAVENOUS | Status: AC
  Filled 2012-01-11: qty 10

## 2012-01-11 MED ORDER — KETOROLAC TROMETHAMINE 60 MG/2ML IM SOLN
60.0000 mg | Freq: Once | INTRAMUSCULAR | Status: AC | PRN
  Administered 2012-01-11: 60 mg via INTRAMUSCULAR

## 2012-01-11 MED ORDER — LACTATED RINGERS IV SOLN
INTRAVENOUS | Status: DC
  Administered 2012-01-11: 125 mL/h via INTRAVENOUS

## 2012-01-11 MED ORDER — FENTANYL CITRATE 0.05 MG/ML IJ SOLN
INTRAMUSCULAR | Status: AC
  Filled 2012-01-11: qty 2

## 2012-01-11 MED ORDER — LEVOTHYROXINE SODIUM 25 MCG PO TABS
25.0000 ug | ORAL_TABLET | Freq: Every day | ORAL | Status: DC
Start: 2012-01-12 — End: 2012-01-14
  Administered 2012-01-12 – 2012-01-14 (×3): 25 ug via ORAL
  Filled 2012-01-11 (×3): qty 1

## 2012-01-11 MED ORDER — NALBUPHINE HCL 10 MG/ML IJ SOLN
5.0000 mg | INTRAMUSCULAR | Status: DC | PRN
  Filled 2012-01-11: qty 1

## 2012-01-11 MED ORDER — MENTHOL 3 MG MT LOZG: 1.0000 | LOZENGE | OROMUCOSAL | Status: DC | PRN

## 2012-01-11 MED ORDER — OXYTOCIN 10 UNIT/ML IJ SOLN
40.0000 [IU] | INTRAVENOUS | Status: DC | PRN
  Administered 2012-01-11: 40 [IU] via INTRAVENOUS

## 2012-01-11 MED ORDER — DIPHENHYDRAMINE HCL 25 MG PO CAPS: 25.0000 mg | ORAL_CAPSULE | Freq: Four times a day (QID) | ORAL | Status: DC | PRN

## 2012-01-11 MED ORDER — LACTATED RINGERS IV BOLUS (SEPSIS): 500.0000 mL | Freq: Once | INTRAVENOUS | Status: DC

## 2012-01-11 MED ORDER — PHENYLEPHRINE HCL 10 MG/ML IJ SOLN
INTRAMUSCULAR | Status: DC | PRN
  Administered 2012-01-11 (×5): 40 ug via INTRAVENOUS
  Administered 2012-01-11 (×3): 80 ug via INTRAVENOUS
  Administered 2012-01-11 (×2): 40 ug via INTRAVENOUS

## 2012-01-11 MED ORDER — WITCH HAZEL-GLYCERIN EX PADS: 1.0000 "application " | MEDICATED_PAD | CUTANEOUS | Status: DC | PRN

## 2012-01-11 MED ORDER — PRENATAL MULTIVITAMIN CH
1.0000 | ORAL_TABLET | Freq: Every day | ORAL | Status: DC
  Administered 2012-01-12 – 2012-01-13 (×2): 1 via ORAL
  Filled 2012-01-11 (×3): qty 1

## 2012-01-11 MED ORDER — ONDANSETRON HCL 4 MG/2ML IJ SOLN
INTRAMUSCULAR | Status: DC | PRN
  Administered 2012-01-11: 4 mg via INTRAVENOUS

## 2012-01-11 MED ORDER — IBUPROFEN 600 MG PO TABS
600.0000 mg | ORAL_TABLET | Freq: Four times a day (QID) | ORAL | Status: DC
  Administered 2012-01-12 – 2012-01-14 (×11): 600 mg via ORAL
  Filled 2012-01-11 (×8): qty 1

## 2012-01-11 MED ORDER — OXYTOCIN 40 UNITS IN LACTATED RINGERS INFUSION - SIMPLE MED: 62.5000 mL/h | INTRAVENOUS | Status: DC

## 2012-01-11 MED ORDER — FENTANYL CITRATE 0.05 MG/ML IJ SOLN: 25.0000 ug | INTRAMUSCULAR | Status: DC | PRN

## 2012-01-11 MED ORDER — DIBUCAINE 1 % RE OINT: 1.0000 "application " | TOPICAL_OINTMENT | RECTAL | Status: DC | PRN

## 2012-01-11 MED ORDER — DEXTROSE 5 % IV SOLN
2.0000 g | INTRAVENOUS | Status: DC | PRN
  Administered 2012-01-11: 2 g via INTRAVENOUS

## 2012-01-11 MED ORDER — SIMETHICONE 80 MG PO CHEW: 80.0000 mg | CHEWABLE_TABLET | ORAL | Status: DC | PRN

## 2012-01-11 MED ORDER — DEXTROSE 5 % IV SOLN
2.0000 g | INTRAVENOUS | Status: DC
  Filled 2012-01-11: qty 2

## 2012-01-11 MED ORDER — ONDANSETRON HCL 4 MG/2ML IJ SOLN: 4.0000 mg | INTRAMUSCULAR | Status: DC | PRN

## 2012-01-11 MED ORDER — MORPHINE SULFATE (PF) 0.5 MG/ML IJ SOLN
INTRAMUSCULAR | Status: DC | PRN
  Administered 2012-01-11: .1 mg via INTRATHECAL

## 2012-01-11 MED ORDER — TETANUS-DIPHTH-ACELL PERTUSSIS 5-2.5-18.5 LF-MCG/0.5 IM SUSP: 0.5000 mL | Freq: Once | INTRAMUSCULAR | Status: DC

## 2012-01-11 MED ORDER — SODIUM CHLORIDE 0.9 % IV SOLN
1.0000 ug/kg/h | INTRAVENOUS | Status: DC | PRN
  Filled 2012-01-11: qty 2.5

## 2012-01-11 MED ORDER — OXYTOCIN 40 UNITS IN LACTATED RINGERS INFUSION - SIMPLE MED
INTRAVENOUS | Status: AC
  Filled 2012-01-11: qty 1000

## 2012-01-11 MED ORDER — KETOROLAC TROMETHAMINE 60 MG/2ML IM SOLN
INTRAMUSCULAR | Status: AC
  Administered 2012-01-11: 60 mg via INTRAMUSCULAR
  Filled 2012-01-11: qty 2

## 2012-01-11 MED ORDER — DIPHENHYDRAMINE HCL 25 MG PO CAPS: 25.0000 mg | ORAL_CAPSULE | ORAL | Status: DC | PRN

## 2012-01-11 MED ORDER — ONDANSETRON HCL 4 MG/2ML IJ SOLN
INTRAMUSCULAR | Status: AC
  Filled 2012-01-11: qty 2

## 2012-01-11 MED ORDER — SCOPOLAMINE 1 MG/3DAYS TD PT72
MEDICATED_PATCH | TRANSDERMAL | Status: AC
  Administered 2012-01-11: 1.5 mg via TRANSDERMAL
  Filled 2012-01-11: qty 1

## 2012-01-11 MED ORDER — OXYTOCIN 10 UNIT/ML IJ SOLN
INTRAMUSCULAR | Status: AC
  Filled 2012-01-11: qty 3

## 2012-01-11 MED ORDER — OXYTOCIN 40 UNITS IN LACTATED RINGERS INFUSION - SIMPLE MED: 1000.0000 mL/h | Freq: Once | INTRAVENOUS | Status: DC

## 2012-01-11 MED ORDER — LANOLIN HYDROUS EX OINT: 1.0000 "application " | TOPICAL_OINTMENT | CUTANEOUS | Status: DC | PRN

## 2012-01-11 MED ORDER — METOCLOPRAMIDE HCL 5 MG/ML IJ SOLN
10.0000 mg | Freq: Three times a day (TID) | INTRAMUSCULAR | Status: DC | PRN
  Administered 2012-01-11: 10 mg via INTRAVENOUS
  Filled 2012-01-11: qty 2

## 2012-01-11 MED ORDER — DIPHENHYDRAMINE HCL 50 MG/ML IJ SOLN: 12.5000 mg | INTRAMUSCULAR | Status: DC | PRN

## 2012-01-11 MED ORDER — ONDANSETRON HCL 4 MG/2ML IJ SOLN: 4.0000 mg | Freq: Three times a day (TID) | INTRAMUSCULAR | Status: DC | PRN

## 2012-01-11 MED ORDER — BUPIVACAINE IN DEXTROSE 0.75-8.25 % IT SOLN
INTRATHECAL | Status: DC | PRN
  Administered 2012-01-11: 1.5 mL via INTRATHECAL

## 2012-01-11 MED ORDER — LACTATED RINGERS IV BOLUS (SEPSIS)
500.0000 mL | Freq: Once | INTRAVENOUS | Status: AC
  Administered 2012-01-11: 14:00:00 via INTRAVENOUS

## 2012-01-11 MED ORDER — SCOPOLAMINE 1 MG/3DAYS TD PT72
1.0000 | MEDICATED_PATCH | Freq: Once | TRANSDERMAL | Status: DC
  Administered 2012-01-11: 1.5 mg via TRANSDERMAL

## 2012-01-11 MED ORDER — LACTATED RINGERS IV SOLN
INTRAVENOUS | Status: DC | PRN
  Administered 2012-01-11 (×3): via INTRAVENOUS

## 2012-01-11 MED ORDER — OXYCODONE-ACETAMINOPHEN 5-325 MG PO TABS
1.0000 | ORAL_TABLET | ORAL | Status: DC | PRN
  Administered 2012-01-12 – 2012-01-13 (×2): 2 via ORAL
  Filled 2012-01-11 (×2): qty 2

## 2012-01-11 MED ORDER — SODIUM CHLORIDE 0.9 % IJ SOLN: 3.0000 mL | INTRAMUSCULAR | Status: DC | PRN

## 2012-01-11 MED ORDER — IBUPROFEN 600 MG PO TABS
600.0000 mg | ORAL_TABLET | Freq: Four times a day (QID) | ORAL | Status: DC | PRN
  Filled 2012-01-11 (×3): qty 1

## 2012-01-11 MED ORDER — SCOPOLAMINE 1 MG/3DAYS TD PT72
1.0000 | MEDICATED_PATCH | Freq: Once | TRANSDERMAL | Status: DC
  Filled 2012-01-11: qty 1

## 2012-01-11 MED ORDER — EPHEDRINE SULFATE 50 MG/ML IJ SOLN
INTRAMUSCULAR | Status: DC | PRN
  Administered 2012-01-11 (×2): 5 mg via INTRAVENOUS
  Administered 2012-01-11: 10 mg via INTRAVENOUS
  Administered 2012-01-11 (×2): 5 mg via INTRAVENOUS
  Administered 2012-01-11: 10 mg via INTRAVENOUS
  Administered 2012-01-11: 5 mg via INTRAVENOUS

## 2012-01-11 MED ORDER — SIMETHICONE 80 MG PO CHEW
80.0000 mg | CHEWABLE_TABLET | Freq: Three times a day (TID) | ORAL | Status: DC
  Administered 2012-01-11 – 2012-01-14 (×9): 80 mg via ORAL

## 2012-01-11 MED ORDER — DIPHENHYDRAMINE HCL 50 MG/ML IJ SOLN: 25.0000 mg | INTRAMUSCULAR | Status: DC | PRN

## 2012-01-11 MED ORDER — SENNOSIDES-DOCUSATE SODIUM 8.6-50 MG PO TABS
2.0000 | ORAL_TABLET | Freq: Every day | ORAL | Status: DC
  Administered 2012-01-11 – 2012-01-13 (×3): 2 via ORAL

## 2012-01-11 SURGICAL SUPPLY — 33 items
CLAMP CORD UMBIL (MISCELLANEOUS) ×4 IMPLANT
CLOTH BEACON ORANGE TIMEOUT ST (SAFETY) ×2 IMPLANT
DRAPE SURG 17X23 STRL (DRAPES) ×2 IMPLANT
DRESSING TELFA 8X3 (GAUZE/BANDAGES/DRESSINGS) IMPLANT
DRSG COVADERM 4X10 (GAUZE/BANDAGES/DRESSINGS) ×2 IMPLANT
DURAPREP 26ML APPLICATOR (WOUND CARE) ×2 IMPLANT
ELECT REM PT RETURN 9FT ADLT (ELECTROSURGICAL) ×2
ELECTRODE REM PT RTRN 9FT ADLT (ELECTROSURGICAL) ×1 IMPLANT
EXTRACTOR VACUUM M CUP 4 TUBE (SUCTIONS) IMPLANT
GAUZE SPONGE 4X4 12PLY STRL LF (GAUZE/BANDAGES/DRESSINGS) IMPLANT
GLOVE BIO SURGEON STRL SZ8 (GLOVE) ×2 IMPLANT
GLOVE SURG ORTHO 8.0 STRL STRW (GLOVE) ×2 IMPLANT
GOWN PREVENTION PLUS LG XLONG (DISPOSABLE) ×4 IMPLANT
KIT ABG SYR 3ML LUER SLIP (SYRINGE) ×4 IMPLANT
NDL SAFETY ECLIPSE 18X1.5 (NEEDLE) ×1 IMPLANT
NEEDLE HYPO 18GX1.5 SHARP (NEEDLE) ×1
NEEDLE HYPO 25X5/8 SAFETYGLIDE (NEEDLE) ×4 IMPLANT
NS IRRIG 1000ML POUR BTL (IV SOLUTION) ×2 IMPLANT
PACK C SECTION WH (CUSTOM PROCEDURE TRAY) ×2 IMPLANT
PAD ABD 7.5X8 STRL (GAUZE/BANDAGES/DRESSINGS) IMPLANT
PAD OB MATERNITY 4.3X12.25 (PERSONAL CARE ITEMS) IMPLANT
SLEEVE SCD COMPRESS KNEE MED (MISCELLANEOUS) IMPLANT
STAPLER VISISTAT 35W (STAPLE) ×2 IMPLANT
SUT MNCRL 0 VIOLET CTX 36 (SUTURE) ×3 IMPLANT
SUT MONOCRYL 0 CTX 36 (SUTURE) ×3
SUT PDS AB 1 CT  36 (SUTURE) ×1
SUT PDS AB 1 CT 36 (SUTURE) ×1 IMPLANT
SUT VIC AB 1 CTX 36 (SUTURE)
SUT VIC AB 1 CTX36XBRD ANBCTRL (SUTURE) IMPLANT
SYRINGE 10CC LL (SYRINGE) ×2 IMPLANT
TOWEL OR 17X24 6PK STRL BLUE (TOWEL DISPOSABLE) ×4 IMPLANT
TRAY FOLEY CATH 14FR (SET/KITS/TRAYS/PACK) ×2 IMPLANT
WATER STERILE IRR 1000ML POUR (IV SOLUTION) IMPLANT

## 2012-01-11 NOTE — Addendum Note (Signed)
Addendum  created 01/11/12 1859 by Renford Dills, CRNA   Modules edited:Notes Section

## 2012-01-11 NOTE — Anesthesia Postprocedure Evaluation (Signed)
  Anesthesia Post-op Note  Patient: Briana Perez  Procedure(s) Performed: Procedure(s) (LRB) with comments: CESAREAN SECTION (N/A) - Primary  Patient Location: Mother/Baby  Anesthesia Type: Spinal  Level of Consciousness: awake  Airway and Oxygen Therapy: Patient Spontanous Breathing  Post-op Pain: mild  Post-op Assessment: Patient's Cardiovascular Status Stable and Respiratory Function Stable  Post-op Vital Signs: stable  Complications: No apparent anesthesia complications

## 2012-01-11 NOTE — Anesthesia Procedure Notes (Signed)
Spinal  Patient location during procedure: OR Start time: 01/11/2012 7:35 AM Staffing Performed by: anesthesiologist  Preanesthetic Checklist Completed: patient identified, site marked, surgical consent, pre-op evaluation, timeout performed, IV checked, risks and benefits discussed and monitors and equipment checked Spinal Block Patient position: sitting Prep: site prepped and draped and DuraPrep Patient monitoring: heart rate, cardiac monitor, continuous pulse ox and blood pressure Approach: midline Location: L3-4 Injection technique: single-shot Needle Needle type: Sprotte  Needle gauge: 24 G Needle length: 9 cm Assessment Sensory level: T4 Additional Notes Clear free flow CSF on first attempt.  No paresthesia.  Patient tolerated procedure well.  Jasmine December, MD

## 2012-01-11 NOTE — Transfer of Care (Signed)
Immediate Anesthesia Transfer of Care Note  Patient: Briana Perez  Procedure(s) Performed: Procedure(s) (LRB) with comments: CESAREAN SECTION (N/A) - Primary  Patient Location: PACU  Anesthesia Type: Spinal  Level of Consciousness: awake, alert  and oriented  Airway & Oxygen Therapy: Patient Spontanous Breathing  Post-op Assessment: Report given to PACU RN and Post -op Vital signs reviewed and stable  Post vital signs: Reviewed and stable  Complications: No apparent anesthesia complications

## 2012-01-11 NOTE — Anesthesia Postprocedure Evaluation (Signed)
Anesthesia Post Note  Patient: Briana Perez  Procedure(s) Performed: Procedure(s) (LRB): CESAREAN SECTION (N/A)  Anesthesia type: Spinal  Patient location: PACU  Post pain: Pain level controlled  Post assessment: Post-op Vital signs reviewed  Last Vitals:  Filed Vitals:   01/11/12 1015  BP: 118/77  Pulse: 64  Temp:   Resp: 18    Post vital signs: Reviewed  Level of consciousness: awake  Complications: No apparent anesthesia complications

## 2012-01-11 NOTE — Anesthesia Preprocedure Evaluation (Signed)
Anesthesia Evaluation  Patient identified by MRN, date of birth, ID band Patient awake    Reviewed: Allergy & Precautions, H&P , NPO status , Patient's Chart, lab work & pertinent test results, reviewed documented beta blocker date and time   History of Anesthesia Complications (+) PONV  Airway Mallampati: II TM Distance: >3 FB Neck ROM: full    Dental  (+) Teeth Intact   Pulmonary neg pulmonary ROS,  breath sounds clear to auscultation        Cardiovascular negative cardio ROS  Rhythm:regular Rate:Normal     Neuro/Psych negative neurological ROS  negative psych ROS   GI/Hepatic GERD- (worse with pregnancy)  Medicated,Cholestasis of pregnancy   Endo/Other  diabetes, Well Controlled, GestationalHypothyroidism Morbid obesity  Renal/GU negative Renal ROS     Musculoskeletal   Abdominal   Peds  Hematology negative hematology ROS (+)   Anesthesia Other Findings   Reproductive/Obstetrics (+) Pregnancy (twins)                           Anesthesia Physical Anesthesia Plan  ASA: III  Anesthesia Plan: Spinal   Post-op Pain Management:    Induction:   Airway Management Planned:   Additional Equipment:   Intra-op Plan:   Post-operative Plan:   Informed Consent: I have reviewed the patients History and Physical, chart, labs and discussed the procedure including the risks, benefits and alternatives for the proposed anesthesia with the patient or authorized representative who has indicated his/her understanding and acceptance.     Plan Discussed with: Surgeon and CRNA  Anesthesia Plan Comments:         Anesthesia Quick Evaluation

## 2012-01-11 NOTE — Op Note (Signed)
Cesarean Section Procedure Note  Pre-operative Diagnosis: IUP at 37 1/2 weeks, Twins Vtx/Breech, Cholestasis, gest HTN, Gest DM (A2)  Post-operative Diagnosis: same  Surgeon: Turner Daniels   Assistants: Morris  Anesthesia:Spinal  Procedure:  Low Segment Transverse cesarean section  Procedure Details  The patient was seen in the Holding Room. The risks, benefits, complications, treatment options, and expected outcomes were discussed with the patient.  The patient concurred with the proposed plan, giving informed consent.  The site of surgery properly noted/marked.. A Time Out was held and the above information confirmed.  After induction of anesthesia, the patient was draped and prepped in the usual sterile manner. A Pfannenstiel incision was made and carried down through the subcutaneous tissue to the fascia. Fascial incision was made and extended transversely. The fascia was separated from the underlying rectus tissue superiorly and inferiorly. The peritoneum was identified and entered. Peritoneal incision was extended longitudinally. The utero-vesical peritoneal reflection was incised transversely and the bladder flap was bluntly freed from the lower uterine segment. A low transverse uterine incision was made. Delivered from Vtx presentation was a with Apgar scores of 9 at one minute and 9 at five minutes was baby A.  Fetal Feet from baby B and AROM then breech extraction of baby B with Apgars of 9,9. After the umbilical cords were clamped and cut cord blood was obtained for evaluation with a clamp left of cord of A. The placentas were removed intact and appeared normal. The uterine outline, tubes and ovaries appeared normal. The uterine incision was closed with running locked sutures of 0 monocryl and imbricated with 0 monocryl. Hemostasis was observed. Lavage was carried out until clear. The peritoneum was then closed with 0 monocryl and rectus muscles plicated in the midline.  After hemostasis  was assured, the fascia was then reapproximated with running sutures of 0 PDS. Irrigation was applied and after adequate hemostasis was assured, the skin was reapproximated with staples.  Instrument, sponge, and needle counts were correct prior the abdominal closure and at the conclusion of the case. The patient received 2 grams cefotetan preoperatively.  Findings: Viable A female, Viable B female  Estimated Blood Loss:  1000cc         Specimens: Placenta was sent to pathology         Complications:  None

## 2012-01-12 ENCOUNTER — Encounter (HOSPITAL_COMMUNITY): Payer: Self-pay | Admitting: Obstetrics and Gynecology

## 2012-01-12 LAB — CBC
HCT: 33 % — ABNORMAL LOW (ref 36.0–46.0)
MCH: 30.9 pg (ref 26.0–34.0)
MCHC: 33.9 g/dL (ref 30.0–36.0)
RDW: 14.2 % (ref 11.5–15.5)

## 2012-01-12 MED ORDER — INFLUENZA VIRUS VACC SPLIT PF IM SUSP
0.5000 mL | INTRAMUSCULAR | Status: AC
  Administered 2012-01-13: 0.5 mL via INTRAMUSCULAR
  Filled 2012-01-12: qty 0.5

## 2012-01-12 NOTE — Progress Notes (Signed)
Subjective: Postpartum Day 1: Cesarean Delivery Patient reports tolerating PO and no problems voiding.    Objective: Vital signs in last 24 hours: Temp:  [97 F (36.1 C)-98.5 F (36.9 C)] 97.6 F (36.4 C) (09/25 0430) Pulse Rate:  [53-130] 83  (09/25 0430) Resp:  [13-19] 16  (09/25 0430) BP: (98-138)/(56-87) 126/74 mmHg (09/25 0430) SpO2:  [95 %-99 %] 97 % (09/25 0430)  Physical Exam:  General: alert and cooperative Lochia: appropriate Uterine Fundus: firm Incision: abd dressing noted with old drainage on bandage DVT Evaluation: No evidence of DVT seen on physical exam. Negative Homan's sign. Blood sugars 75-85 mg/dl  Basename 16/10/96 0454 01/11/12 1414  HGB 11.2* 12.0  HCT 33.0* 35.5*    Assessment/Plan: Status post Cesarean section. Doing well postoperatively.  Continue current care Resume regular diet.  Ares Tegtmeyer G 01/12/2012, 8:06 AM

## 2012-01-12 NOTE — Progress Notes (Signed)
UR chart review completed.  

## 2012-01-13 NOTE — Progress Notes (Signed)
Subjective: Postpartum Day 2: Cesarean Delivery Patient reports tolerating PO, + flatus and no problems voiding.  Denies HA, blurred vision or RUQ pain. Pedal edema continues  Objective: Vital signs in last 24 hours: Temp:  [97.7 F (36.5 C)-98.2 F (36.8 C)] 98.2 F (36.8 C) (09/26 0600) Pulse Rate:  [70-98] 80  (09/26 0600) Resp:  [18-20] 18  (09/26 0600) BP: (126-155)/(78-95) 148/93 mmHg (09/26 0700) SpO2:  [97 %-99 %] 97 % (09/26 0600)  Physical Exam:  General: alert and cooperative Lochia: appropriate Uterine Fundus: firm Incision: healing well, staples intact DVT Evaluation: No evidence of DVT seen on physical exam. Negative Homan's sign.   Basename 01/12/12 0550 01/11/12 1414  HGB 11.2* 12.0  HCT 33.0* 35.5*    Assessment/Plan: Status post Cesarean section. Postoperative course complicated by GTN  CBC and CMET in am.  CURTIS,CAROL G 01/13/2012, 8:03 AM

## 2012-01-14 LAB — CBC WITH DIFFERENTIAL/PLATELET
Basophils Absolute: 0 10*3/uL (ref 0.0–0.1)
Basophils Relative: 1 % (ref 0–1)
Eosinophils Absolute: 0.1 10*3/uL (ref 0.0–0.7)
Eosinophils Relative: 2 % (ref 0–5)
Lymphocytes Relative: 21 % (ref 12–46)
Lymphs Abs: 1 10*3/uL (ref 0.7–4.0)
MCH: 31.4 pg (ref 26.0–34.0)
Monocytes Absolute: 0.6 10*3/uL (ref 0.1–1.0)
Neutrophils Relative %: 65 % (ref 43–77)
Platelets: 203 10*3/uL (ref 150–400)
RBC: 3.41 MIL/uL — ABNORMAL LOW (ref 3.87–5.11)
RDW: 14.6 % (ref 11.5–15.5)
WBC: 4.9 10*3/uL (ref 4.0–10.5)

## 2012-01-14 LAB — TYPE AND SCREEN
ABO/RH(D): O POS
Antibody Screen: NEGATIVE
Unit division: 0

## 2012-01-14 LAB — COMPREHENSIVE METABOLIC PANEL
ALT: 21 U/L (ref 0–35)
AST: 36 U/L (ref 0–37)
Albumin: 1.6 g/dL — ABNORMAL LOW (ref 3.5–5.2)
Alkaline Phosphatase: 269 U/L — ABNORMAL HIGH (ref 39–117)
Calcium: 8.2 mg/dL — ABNORMAL LOW (ref 8.4–10.5)
Glucose, Bld: 78 mg/dL (ref 70–99)
Potassium: 3.8 mEq/L (ref 3.5–5.1)
Sodium: 138 mEq/L (ref 135–145)
Total Protein: 4.9 g/dL — ABNORMAL LOW (ref 6.0–8.3)

## 2012-01-14 MED ORDER — OXYCODONE-ACETAMINOPHEN 5-325 MG PO TABS: 1.0000 | ORAL_TABLET | ORAL | Status: AC | PRN

## 2012-01-14 MED ORDER — IBUPROFEN 600 MG PO TABS: 600.0000 mg | ORAL_TABLET | Freq: Four times a day (QID) | ORAL | Status: AC | PRN

## 2012-01-14 NOTE — Discharge Summary (Signed)
Obstetric Discharge Summary Reason for Admission: cesarean section Prenatal Procedures: ultrasound Intrapartum Procedures: cesarean: low cervical, transverse Postpartum Procedures: none Complications-Operative and Postpartum: none Hemoglobin  Date Value Range Status  01/14/2012 10.7* 12.0 - 15.0 g/dL Final     HCT  Date Value Range Status  01/14/2012 31.2* 36.0 - 46.0 % Final    Physical Exam:  General: alert and cooperative Lochia: appropriate Uterine Fundus: firm Incision: healing well, staples removed DVT Evaluation: No evidence of DVT seen on physical exam. Negative Homan's sign.  Discharge Diagnoses: Term Pregnancy-delivered  Discharge Information: Date: 01/14/2012 Activity: pelvic rest Diet: routine Medications: None, Ibuprofen, Percocet and synthroid Condition: stable Instructions: refer to practice specific booklet Discharge to: home   Newborn Data:   Shiela, Bruns [[id number]]  Live born female  Birth Weight: 5 lb 13.1 oz (2639 g) APGAR: 9, 9   Rushie, Brazel [[id number]]  Live born female  Birth Weight: 5 lb 3.8 oz (2376 g) APGAR: 9, 9  Home with mother.  Lajoya Dombek G 01/14/2012, 8:38 AM

## 2012-01-17 NOTE — Progress Notes (Signed)
Post discharge chart review completed.  

## 2014-02-18 ENCOUNTER — Encounter (HOSPITAL_COMMUNITY): Payer: Self-pay | Admitting: Obstetrics and Gynecology

## 2014-04-03 ENCOUNTER — Other Ambulatory Visit: Payer: Self-pay | Admitting: Obstetrics and Gynecology

## 2014-04-04 LAB — CYTOLOGY - PAP

## 2014-08-19 ENCOUNTER — Ambulatory Visit (INDEPENDENT_AMBULATORY_CARE_PROVIDER_SITE_OTHER): Payer: BC Managed Care – PPO | Admitting: Podiatry

## 2014-08-19 ENCOUNTER — Encounter: Payer: Self-pay | Admitting: Podiatry

## 2014-08-19 VITALS — BP 131/83 | HR 87 | Resp 15

## 2014-08-19 DIAGNOSIS — M779 Enthesopathy, unspecified: Secondary | ICD-10-CM

## 2014-08-19 DIAGNOSIS — Q828 Other specified congenital malformations of skin: Secondary | ICD-10-CM | POA: Diagnosis not present

## 2014-08-19 NOTE — Progress Notes (Signed)
   Subjective:    Patient ID: Briana Perez, female    DOB: 01/03/1966, 49 y.o.   MRN: [id number]  HPI Pt presents with painful bilateral plantar warts and callus   Review of Systems  All other systems reviewed and are negative.      Objective:   Physical Exam        Assessment & Plan:

## 2014-08-20 NOTE — Progress Notes (Signed)
Subjective:     Patient ID: Briana Perez, female   DOB: 10/12/1965, 49 y.o.   MRN: [id number]  HPI patient presents with painful lesions on both feet that she states a been there for a long time and making ambulation difficult or makes walking difficult   Review of Systems  All other systems reviewed and are negative.      Objective:   Physical Exam  Constitutional: She is oriented to person, place, and time.  Cardiovascular: Intact distal pulses.   Musculoskeletal: Normal range of motion.  Neurological: She is oriented to person, place, and time.  Skin: Skin is warm.  Nursing note and vitals reviewed.  neurovascular status intact with muscle strength adequate range of motion within normal limits. Patient's noted to have painful keratotic lesion subsecond metatarsal both feet that are quite sore when pressed and over top the metatarsal head itself. Patient's noted to have good digital perfusion and is well oriented 3     Assessment:     Plantarflexed metatarsals with keratotic lesion subsecond bilateral secondary to mechanical dysfunction    Plan:     H&P and x-rays reviewed with patient. Today deep debridement of lesions accomplished and went ahead and scanned for custom orthotics to diffuse and reduce pressure against the plantar flexed prominent metatarsal heads

## 2014-10-04 ENCOUNTER — Ambulatory Visit: Payer: BC Managed Care – PPO | Admitting: *Deleted

## 2014-10-04 DIAGNOSIS — M779 Enthesopathy, unspecified: Secondary | ICD-10-CM

## 2014-10-04 NOTE — Patient Instructions (Signed)

## 2014-10-04 NOTE — Progress Notes (Signed)
Patient ID: Briana Perez, female   DOB: 01/29/1966, 49 y.o.   MRN: [id number] PICKING UP INSERTS

## 2015-01-16 ENCOUNTER — Ambulatory Visit (INDEPENDENT_AMBULATORY_CARE_PROVIDER_SITE_OTHER): Payer: BC Managed Care – PPO | Admitting: Podiatry

## 2015-01-16 ENCOUNTER — Ambulatory Visit (INDEPENDENT_AMBULATORY_CARE_PROVIDER_SITE_OTHER): Payer: BC Managed Care – PPO

## 2015-01-16 ENCOUNTER — Encounter: Payer: Self-pay | Admitting: Podiatry

## 2015-01-16 DIAGNOSIS — M722 Plantar fascial fibromatosis: Secondary | ICD-10-CM | POA: Diagnosis not present

## 2015-01-16 MED ORDER — TRIAMCINOLONE ACETONIDE 10 MG/ML IJ SUSP
10.0000 mg | Freq: Once | INTRAMUSCULAR | Status: AC
  Administered 2015-01-16: 10 mg

## 2015-01-16 MED ORDER — DICLOFENAC SODIUM 75 MG PO TBEC: 75.0000 mg | DELAYED_RELEASE_TABLET | Freq: Two times a day (BID) | ORAL | Status: AC

## 2015-01-16 NOTE — Progress Notes (Signed)
Subjective:     Patient ID: Briana Perez, female   DOB: [birth year], 49 y.o.   MRN: [id number]  HPI patient states my right heel has started to become extremely sore and making it hard for me to walk. States it's been around a month   Review of Systems     Objective:   Physical Exam Neurovascular status intact muscle strength adequate range of motion within normal limits with patient noted to have exquisite discomfort in the plantar aspect of the right heel at the insertional point tendon into the calcaneus with moderate depression of the arch noted so noted to have significant distal callus formation bilateral    Assessment:     Plantar fasciitis right heel along with keratotic distal lesion formation bilateral feet    Plan:     H&P and conditions reviewed with patient. I've recommended treatment for the right heel and reviewed x-rays and injected the right plantar fascia 3 mg Kenalog 5 mg Xylocaine and went ahead and applied fascial brace and gave instructions on physical therapy. I then debrided lesions on both feet and reappoint to recheck

## 2015-01-16 NOTE — Patient Instructions (Signed)

## 2015-01-30 ENCOUNTER — Ambulatory Visit (INDEPENDENT_AMBULATORY_CARE_PROVIDER_SITE_OTHER): Payer: BC Managed Care – PPO | Admitting: Podiatry

## 2015-01-30 ENCOUNTER — Encounter: Payer: Self-pay | Admitting: Podiatry

## 2015-01-30 DIAGNOSIS — M722 Plantar fascial fibromatosis: Secondary | ICD-10-CM

## 2015-01-31 NOTE — Progress Notes (Signed)
Subjective:     Patient ID: Leeroy BockMiriam W Vangorden, female   DOB: 06/28/1965, 49 y.o.   MRN: [id number]  HPI patient states my right heel is significantly improved with minimal discomfort upon palpation and able to walk distances without pain   Review of Systems     Objective:   Physical Exam Neurovascular status intact with diminishment of discomfort of a significant nature plantar aspect right heel    Assessment:     Plantar fasciitis right improved    Plan:     Advised on physical therapy anti-inflammatories supportive shoe gear and reappoint for orthotics if symptoms persist
